# Patient Record
Sex: Male | Born: 1980 | Race: White | Hispanic: No | Marital: Single
Health system: Southern US, Community
[De-identification: ages and names within clinical notes are randomized; demographics above are authoritative.]

---

## 2006-12-25 ENCOUNTER — Emergency Department (HOSPITAL_COMMUNITY): Admission: EM | Admit: 2006-12-25 | Discharge: 2006-12-25 | Payer: Self-pay | Admitting: Emergency Medicine

## 2009-01-12 ENCOUNTER — Encounter: Admission: RE | Admit: 2009-01-12 | Discharge: 2009-01-12 | Payer: Self-pay | Admitting: Occupational Medicine

## 2009-11-05 IMAGING — CR DG CHEST 1V
1 series · 1 of 1 positions shown · non-contrast
Comparison: None

CLINICAL DATA: Pre-employment examination.

CHEST - 1 VIEW

[view not recorded]
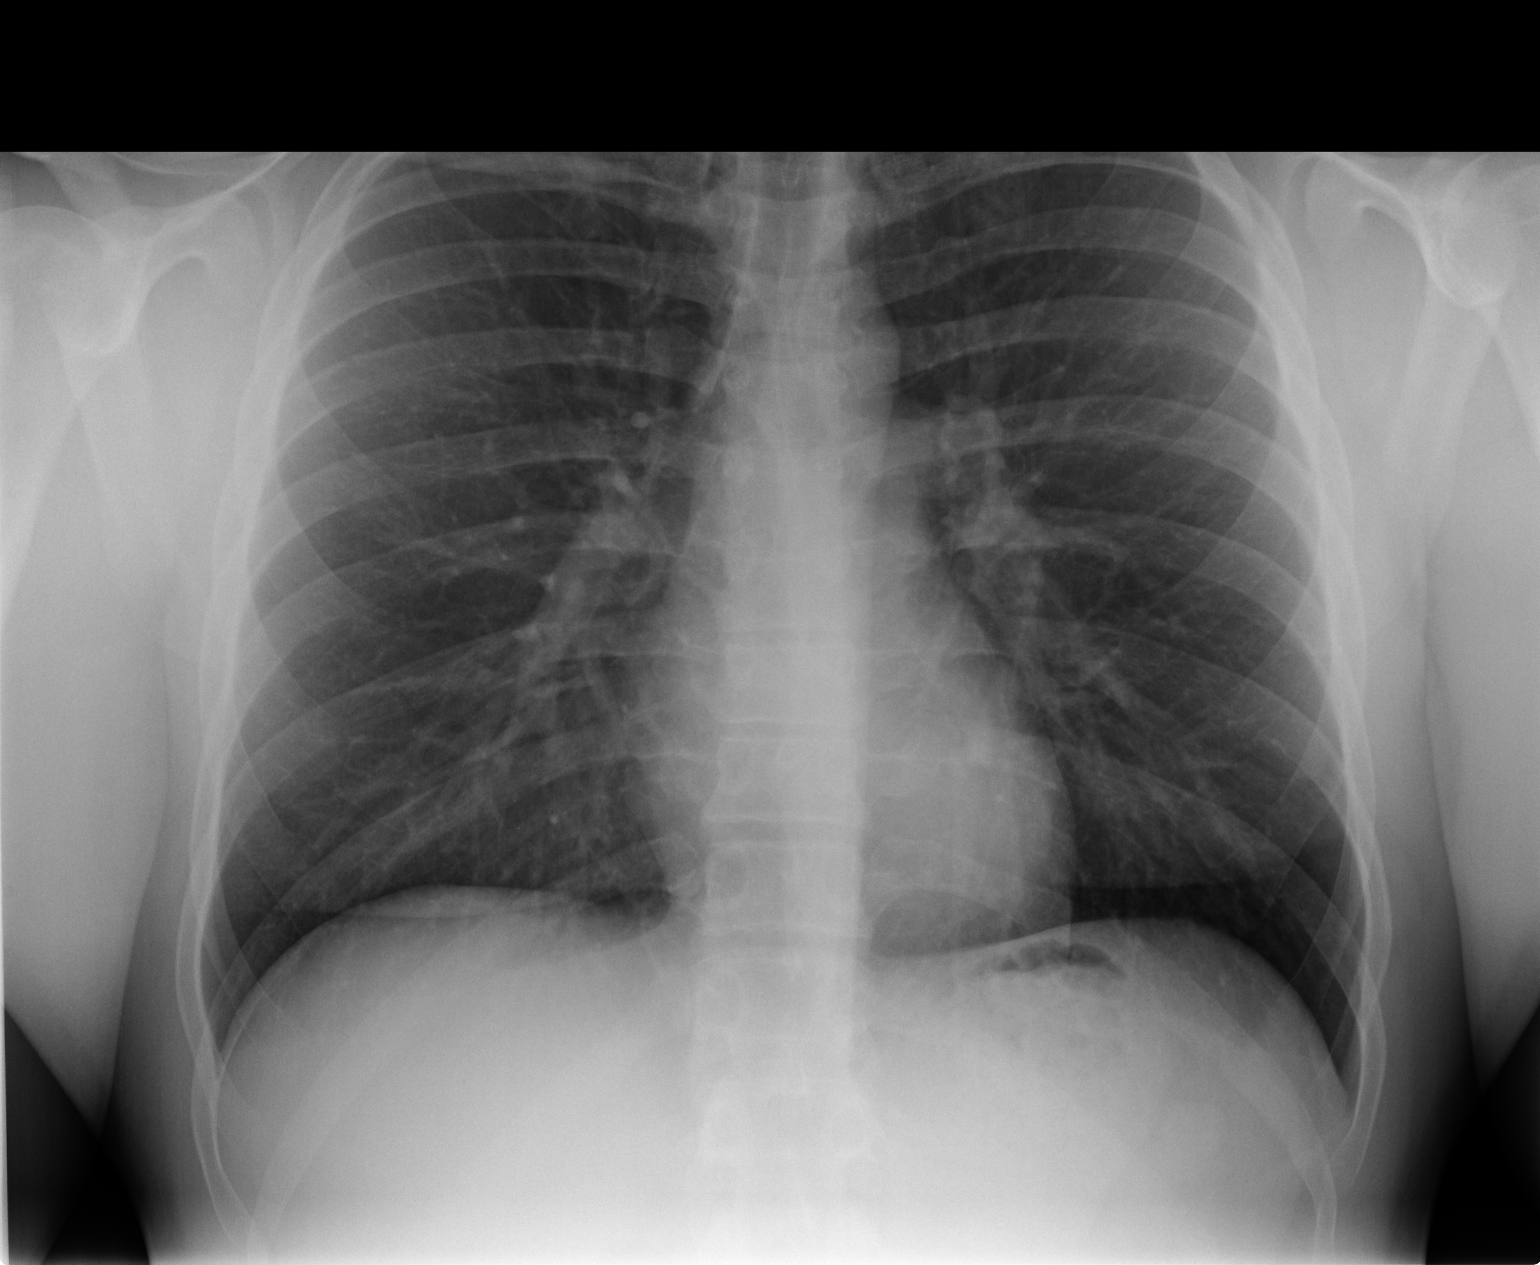

[1 of 1 positions shown; findings below may reference images not displayed]

FINDINGS: Heart size is normal.  The mediastinum is unremarkable.
Lungs are clear.  No effusions.  No bony abnormality.
IMPRESSION: Normal one-view chest

## 2019-10-22 ENCOUNTER — Ambulatory Visit: Payer: 59 | Attending: Internal Medicine

## 2019-10-22 DIAGNOSIS — Z23 Encounter for immunization: Secondary | ICD-10-CM

## 2019-10-22 NOTE — Progress Notes (Signed)
   [facility]ov[facility] Vaccination [facility]  Name:  Caleb Peterson    MRN: [id number] DOB: February 14, 1981  10/22/2019  [name] was observed post Covid-19 immunization for 15 minutes without incident. He was provided with Vaccine Information Sheet and instruction to access the V-Safe system.   [name] was instructed to call 911 with any severe reactions post vaccine: Marland Kitchen Difficulty breathing  . Swelling of face and throat  . A fast heartbeat  . A bad rash all over body  . Dizziness and weakness   Immunizations Administered    Name Date Dose VIS Date Route   Moderna COVID-19 Vaccine 10/22/2019 11:17 AM 0.5 mL 07/13/2019 Intramuscular   Manufacturer: Moderna   Lot: [id number]   NDC: 86761-950-93

## 2019-11-24 ENCOUNTER — Ambulatory Visit: Payer: 59 | Attending: Internal Medicine

## 2019-11-24 DIAGNOSIS — Z23 Encounter for immunization: Secondary | ICD-10-CM

## 2019-11-24 NOTE — Progress Notes (Signed)
   Covid-[facility] Vaccination Clinic  Name:  Rhoderick Farrel    MRN: [id number] DOB: 1981-05-19  11/24/2019  Mr. Brickell was observed post Covid-19 immunization for 15 minutes without incident. He was provided with Vaccine Information Sheet and instruction to access the V-Safe system.   Mr. Quintela was instructed to call 911 with any severe reactions post vaccine: Marland Kitchen Difficulty breathing  . Swelling of face and throat  . A fast heartbeat  . A bad rash all over body  . Dizziness and weakness   Immunizations Administered    Name Date Dose VIS Date Route   Moderna COVID-19 Vaccine 11/24/2019 11:27 AM 0.5 mL 07/13/2019 Intramuscular   Manufacturer: Moderna   Lot: [id number]   NDC: 37543-606-77

## 2022-03-25 ENCOUNTER — Other Ambulatory Visit: Payer: Self-pay | Admitting: Nurse Practitioner

## 2022-03-25 ENCOUNTER — Ambulatory Visit
Admission: RE | Admit: 2022-03-25 | Discharge: 2022-03-25 | Disposition: A | Discharge status: Home / Self Care | Payer: No Typology Code available for payment source | Source: Ambulatory Visit | Attending: Nurse Practitioner | Admitting: Nurse Practitioner

## 2022-03-25 DIAGNOSIS — Z0000 Encounter for general adult medical examination without abnormal findings: Secondary | ICD-10-CM

## 2023-09-26 ENCOUNTER — Encounter: Payer: BLUE CROSS/BLUE SHIELD | Admitting: Urology
